# Patient Record
Sex: Male | Born: 1995 | Race: White | Hispanic: No | Marital: Single | Smoking: Never smoker
Health system: Southern US, Community
[De-identification: ages and names within clinical notes are randomized; demographics above are authoritative.]

---
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
## 2018-03-16 ENCOUNTER — Encounter (HOSPITAL_COMMUNITY): Payer: Self-pay

## 2018-03-16 ENCOUNTER — Emergency Department (HOSPITAL_COMMUNITY): Payer: BC Managed Care – PPO

## 2018-03-16 ENCOUNTER — Emergency Department (HOSPITAL_COMMUNITY)
Admission: EM | Admit: 2018-03-16 | Discharge: 2018-03-16 | Disposition: A | Discharge status: Home / Self Care | Payer: BC Managed Care – PPO | Attending: Emergency Medicine | Admitting: Emergency Medicine

## 2018-03-16 ENCOUNTER — Other Ambulatory Visit: Payer: Self-pay

## 2018-03-16 DIAGNOSIS — S0285XA Fracture of orbit, unspecified, initial encounter for closed fracture: Secondary | ICD-10-CM

## 2018-03-16 DIAGNOSIS — Y929 Unspecified place or not applicable: Secondary | ICD-10-CM | POA: Diagnosis not present

## 2018-03-16 DIAGNOSIS — S0280XA Fracture of other specified skull and facial bones, unspecified side, initial encounter for closed fracture: Secondary | ICD-10-CM

## 2018-03-16 DIAGNOSIS — S0240CA Maxillary fracture, right side, initial encounter for closed fracture: Secondary | ICD-10-CM | POA: Insufficient documentation

## 2018-03-16 DIAGNOSIS — Y9317 Activity, water skiing and wake boarding: Secondary | ICD-10-CM | POA: Diagnosis not present

## 2018-03-16 DIAGNOSIS — S0990XA Unspecified injury of head, initial encounter: Secondary | ICD-10-CM

## 2018-03-16 DIAGNOSIS — S060X0A Concussion without loss of consciousness, initial encounter: Secondary | ICD-10-CM | POA: Insufficient documentation

## 2018-03-16 DIAGNOSIS — W51XXXA Accidental striking against or bumped into by another person, initial encounter: Secondary | ICD-10-CM | POA: Insufficient documentation

## 2018-03-16 DIAGNOSIS — Y998 Other external cause status: Secondary | ICD-10-CM | POA: Diagnosis not present

## 2018-03-16 MED ORDER — IBUPROFEN 800 MG PO TABS: 800.0000 mg | ORAL_TABLET | Freq: Three times a day (TID) | ORAL | 0 refills | Status: AC | PRN
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
## 2020-05-21 ENCOUNTER — Other Ambulatory Visit: Payer: BC Managed Care – PPO

## 2020-05-21 DIAGNOSIS — Z20822 Contact with and (suspected) exposure to covid-19: Secondary | ICD-10-CM

## 2020-05-23 LAB — NOVEL CORONAVIRUS, NAA: SARS-CoV-2, NAA: NOT DETECTED

## 2020-05-23 LAB — SARS-COV-2, NAA 2 DAY TAT
